# Patient Record
Sex: Male | Born: 1995 | Race: White | Hispanic: No | Marital: Single
Health system: Southern US, Community
[De-identification: ages and names within clinical notes are randomized; demographics above are authoritative.]

## PROBLEM LIST (undated history)

## (undated) DIAGNOSIS — L709 Acne, unspecified: Secondary | ICD-10-CM

## (undated) HISTORY — DX: Acne, unspecified: L70.9

---
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
## 2015-01-07 ENCOUNTER — Ambulatory Visit: Payer: Self-pay

## 2015-01-07 ENCOUNTER — Other Ambulatory Visit: Payer: Self-pay | Admitting: Occupational Medicine

## 2015-01-07 DIAGNOSIS — S6991XA Unspecified injury of right wrist, hand and finger(s), initial encounter: Secondary | ICD-10-CM

## 2015-09-09 ENCOUNTER — Encounter: Payer: Self-pay | Admitting: Pediatrics

## 2019-10-29 DIAGNOSIS — K4090 Unilateral inguinal hernia, without obstruction or gangrene, not specified as recurrent: Secondary | ICD-10-CM | POA: Diagnosis not present

## 2019-11-05 DIAGNOSIS — R1909 Other intra-abdominal and pelvic swelling, mass and lump: Secondary | ICD-10-CM | POA: Diagnosis not present

## 2019-11-06 ENCOUNTER — Other Ambulatory Visit: Payer: Self-pay | Admitting: Nurse Practitioner

## 2019-11-06 DIAGNOSIS — R1909 Other intra-abdominal and pelvic swelling, mass and lump: Secondary | ICD-10-CM

## 2019-11-11 ENCOUNTER — Ambulatory Visit
Admission: RE | Admit: 2019-11-11 | Discharge: 2019-11-11 | Disposition: A | Discharge status: Home / Self Care | Payer: BC Managed Care – PPO | Source: Ambulatory Visit | Attending: Nurse Practitioner | Admitting: Nurse Practitioner

## 2019-11-11 DIAGNOSIS — R1909 Other intra-abdominal and pelvic swelling, mass and lump: Secondary | ICD-10-CM

## 2019-11-11 DIAGNOSIS — R590 Localized enlarged lymph nodes: Secondary | ICD-10-CM | POA: Diagnosis not present

## 2021-11-28 IMAGING — US US PELVIS LIMITED
1 series · 14 of 17 positions shown · non-contrast
Comparison: None.

CLINICAL DATA: Left groin palpable mass

EXAM:
LIMITED ULTRASOUND OF PELVIS
TECHNIQUE: Limited transabdominal ultrasound examination of the pelvis was
performed.

[Series 1: us pelvis limited · 0.08mm/px · 17 acquisitions, 14 frames shown]
[im 1/17]
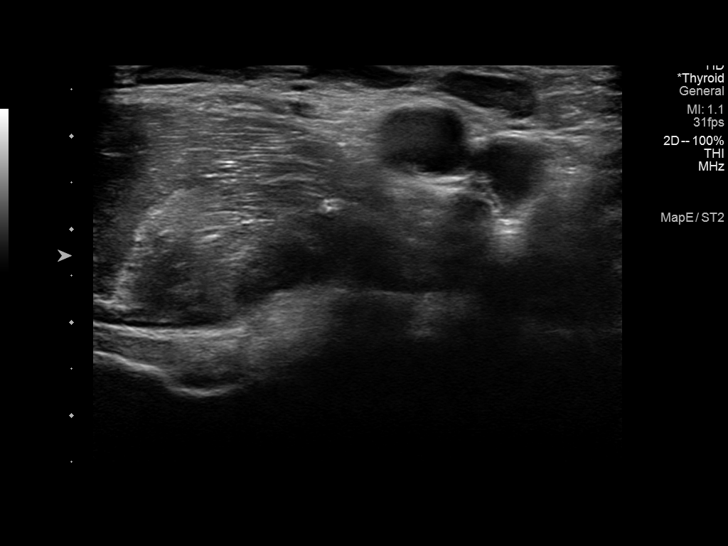
[im 2/17]
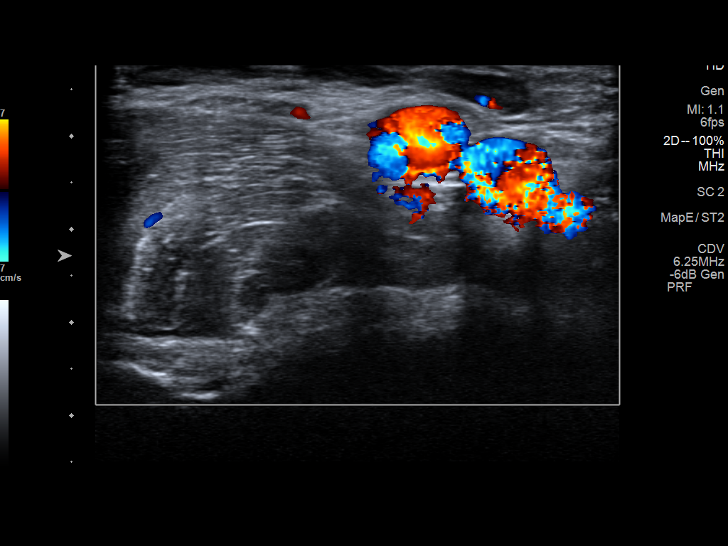
[im 4/17]
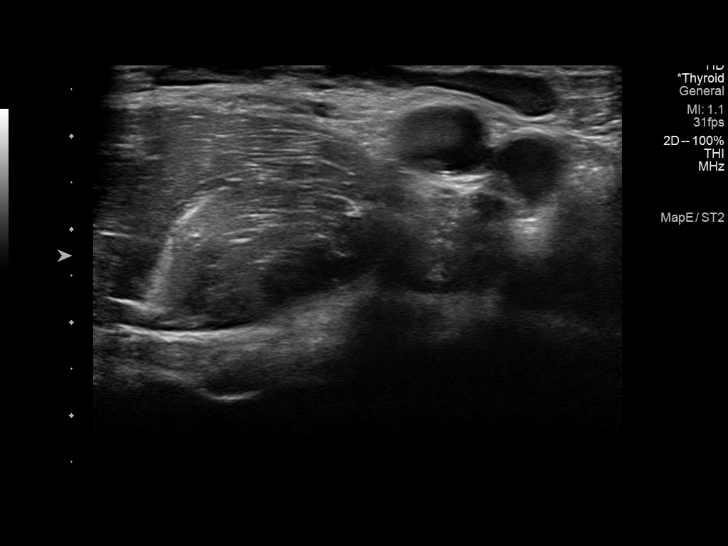
[im 5/17]
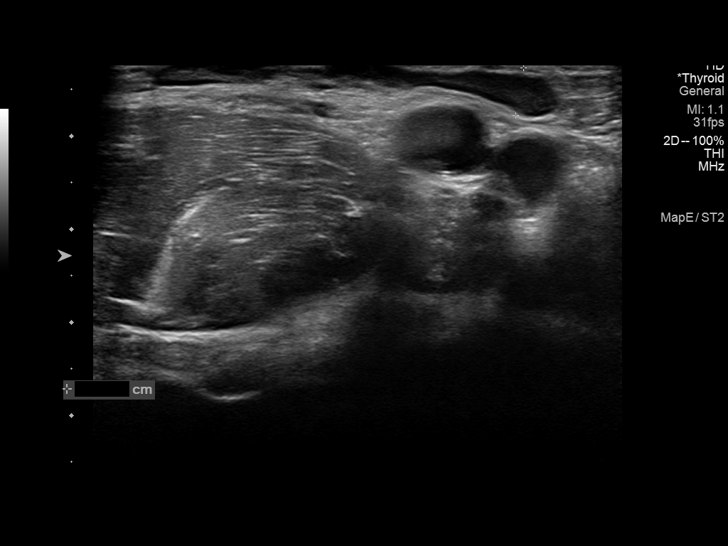
[im 6/17]
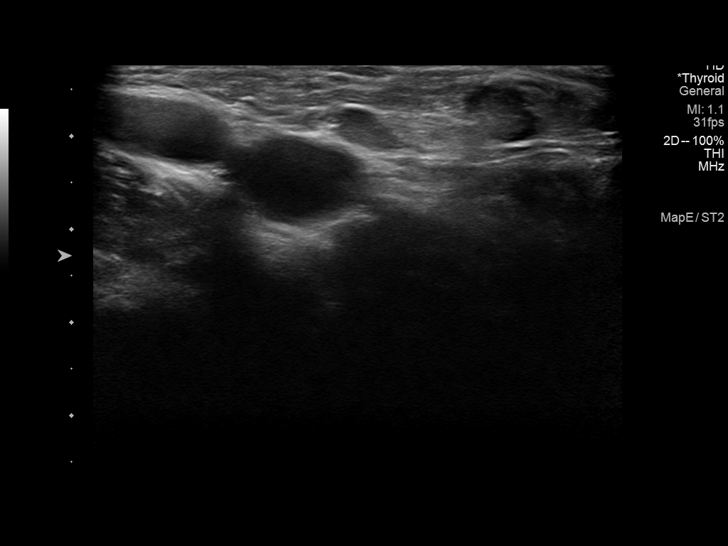
[im 7/17]
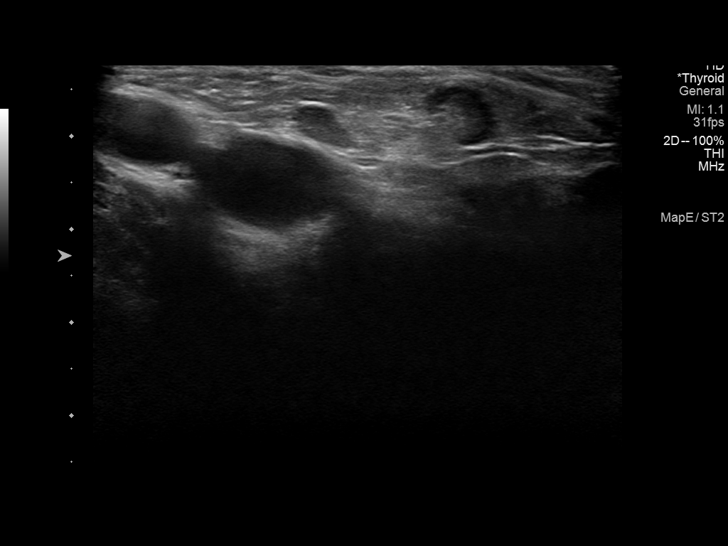
[im 8/17]
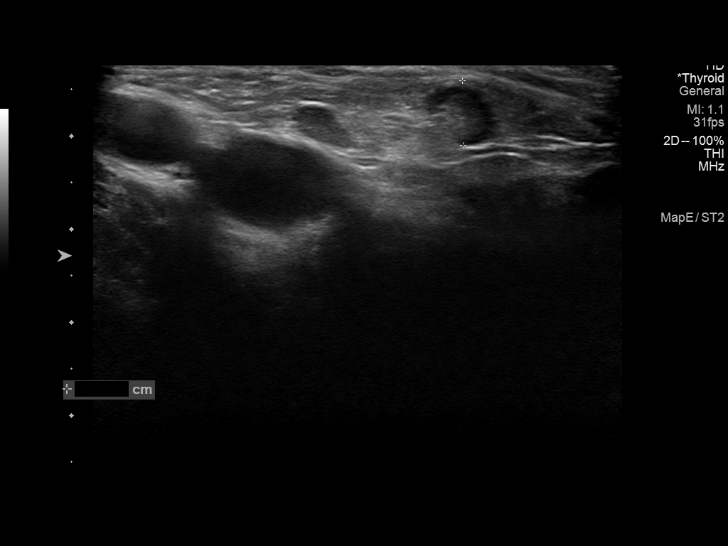
[im 10/17]
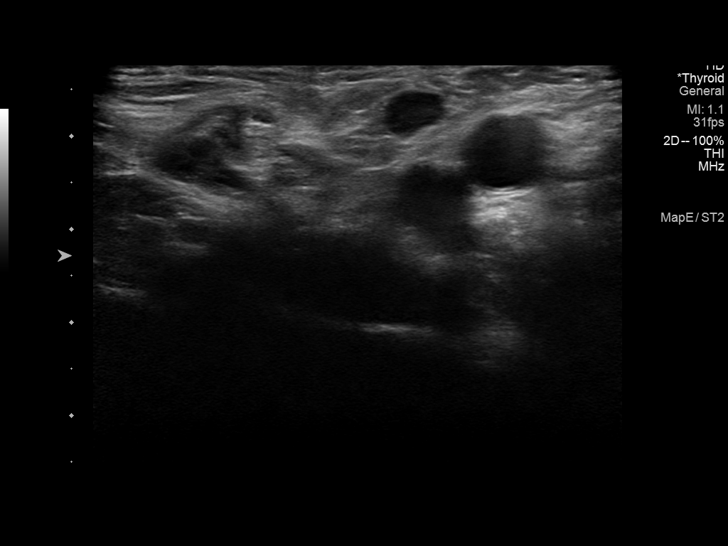
[im 11/17]
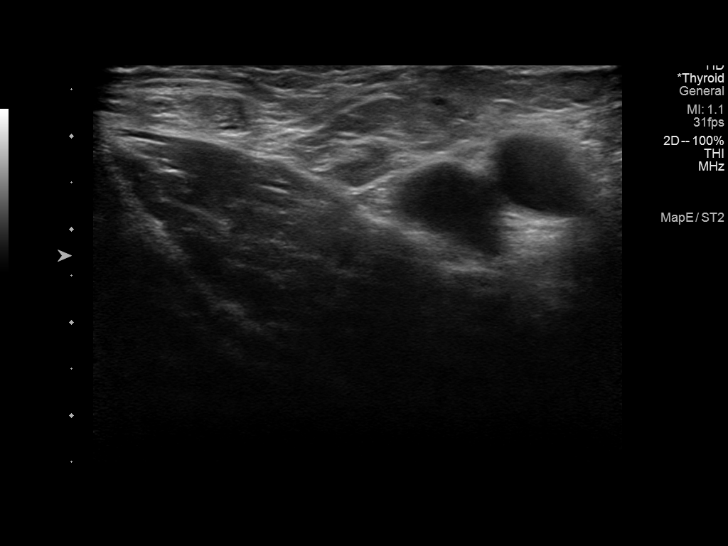
[im 12/17]
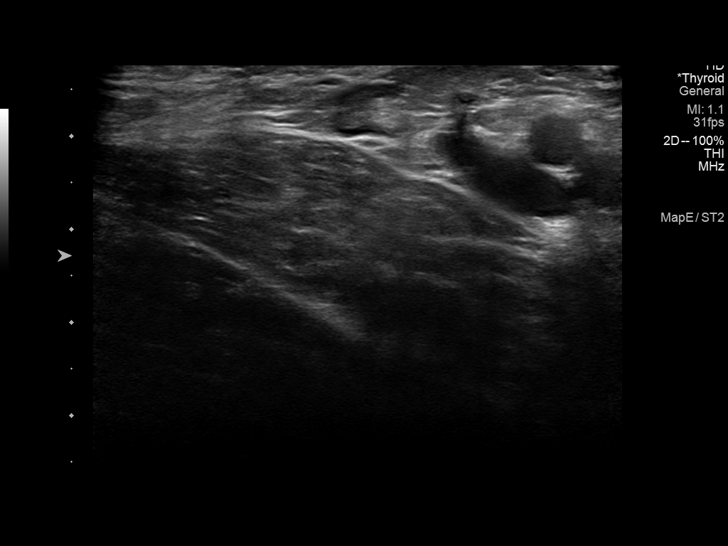
[im 13/17]
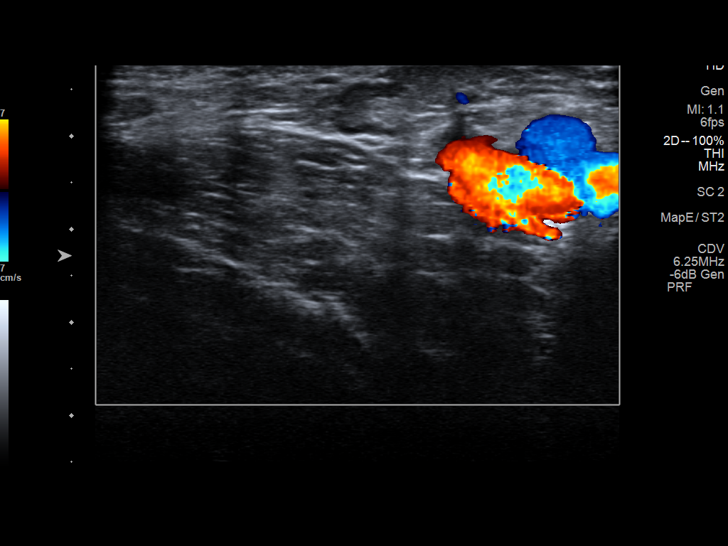
[im 14/17]
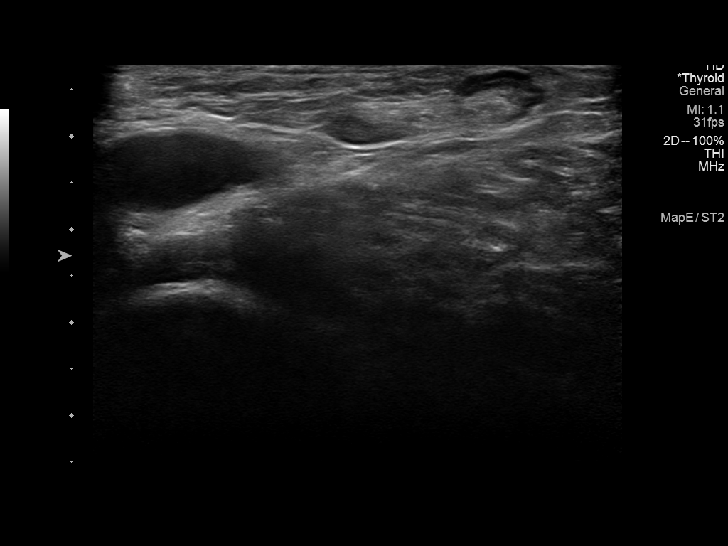
[im 16/17]
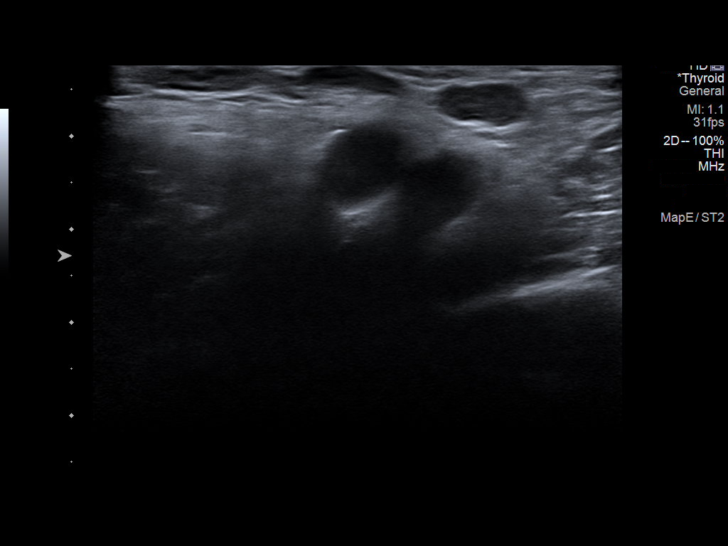
[im 17/17]
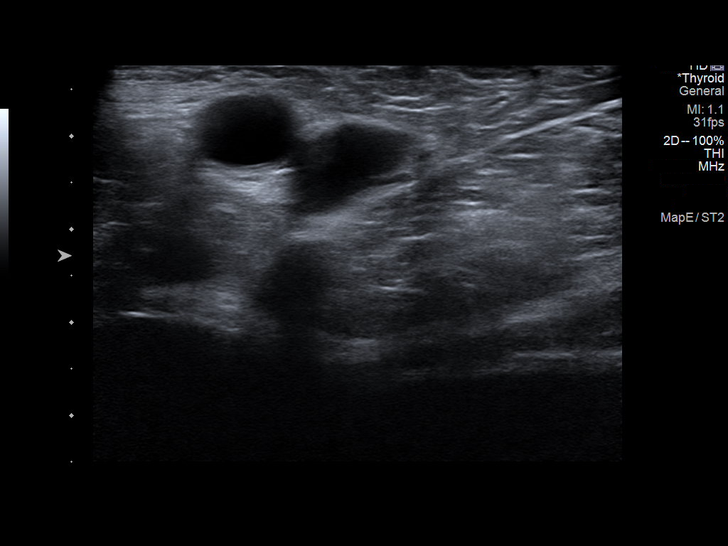

[14 of 17 positions shown; findings below may reference images not displayed]

FINDINGS: Small lymph nodes are noted in the left inguinal region. These are
less than a cm and maintain normal central fatty hila. No abnormally
sized or appearing lymph nodes.
IMPRESSION: Small left inguinal lymph nodes, none pathologically enlarged.
